# Patient Record
Sex: Female | Born: 2005 | Race: Black or African American | Hispanic: No | Marital: Single | Smoking: Never smoker
Health system: Southern US, Community
[De-identification: ages and names within clinical notes are randomized; demographics above are authoritative.]

## PROBLEM LIST (undated history)

## (undated) DIAGNOSIS — J45909 Unspecified asthma, uncomplicated: Secondary | ICD-10-CM

---
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
## 2023-08-19 ENCOUNTER — Encounter (HOSPITAL_COMMUNITY): Payer: Self-pay

## 2023-08-19 ENCOUNTER — Other Ambulatory Visit: Payer: Self-pay

## 2023-08-19 ENCOUNTER — Emergency Department (HOSPITAL_COMMUNITY)
Admission: EM | Admit: 2023-08-19 | Discharge: 2023-08-19 | Disposition: A | Discharge status: Home / Self Care | Payer: Medicaid Other | Attending: Emergency Medicine | Admitting: Emergency Medicine

## 2023-08-19 DIAGNOSIS — N72 Inflammatory disease of cervix uteri: Secondary | ICD-10-CM | POA: Insufficient documentation

## 2023-08-19 DIAGNOSIS — R102 Pelvic and perineal pain: Secondary | ICD-10-CM | POA: Diagnosis present

## 2023-08-19 LAB — WET PREP, GENITAL
Clue Cells Wet Prep HPF POC: NONE SEEN
Sperm: NONE SEEN
Trich, Wet Prep: NONE SEEN
WBC, Wet Prep HPF POC: >=10 — AB (ref <10)
Yeast Wet Prep HPF POC: NONE SEEN

## 2023-08-19 LAB — URINALYSIS, ROUTINE W REFLEX MICROSCOPIC
Bilirubin Urine: NEGATIVE
Glucose, UA: NEGATIVE mg/dL
Hgb urine dipstick: NEGATIVE
Ketones, ur: NEGATIVE mg/dL
Nitrite: NEGATIVE
Protein, ur: NEGATIVE mg/dL
Specific Gravity, Urine: 1.02 (ref 1.005–1.030)
WBC, UA: >50 WBC/hpf (ref 0–5)
pH: 6 (ref 5.0–8.0)

## 2023-08-19 LAB — HIV ANTIBODY (ROUTINE TESTING W REFLEX): HIV Screen 4th Generation wRfx: NONREACTIVE

## 2023-08-19 LAB — PREGNANCY, URINE: Preg Test, Ur: NEGATIVE

## 2023-08-19 MED ORDER — DOXYCYCLINE HYCLATE 100 MG PO CAPS: 100.0000 mg | ORAL_CAPSULE | Freq: Two times a day (BID) | ORAL | 0 refills | Status: DC

## 2023-08-19 MED ORDER — METRONIDAZOLE 500 MG PO TABS
2000.0000 mg | ORAL_TABLET | Freq: Once | ORAL | Status: AC
Start: 2023-08-19 — End: 2023-08-19
  Administered 2023-08-19: 2000 mg via ORAL
  Filled 2023-08-19: qty 4

## 2023-08-19 MED ORDER — LIDOCAINE HCL (PF) 1 % IJ SOLN
INTRAMUSCULAR | Status: AC
  Administered 2023-08-19: 1.5 mL
  Filled 2023-08-19: qty 5

## 2023-08-19 MED ORDER — AZITHROMYCIN 250 MG PO TABS
1000.0000 mg | ORAL_TABLET | Freq: Once | ORAL | Status: AC
  Administered 2023-08-19: 1000 mg via ORAL
  Filled 2023-08-19: qty 4

## 2023-08-19 MED ORDER — CEFTRIAXONE SODIUM 500 MG IJ SOLR
500.0000 mg | Freq: Once | INTRAMUSCULAR | Status: AC
  Administered 2023-08-19: 500 mg via INTRAMUSCULAR
  Filled 2023-08-19: qty 500

## 2023-08-19 NOTE — ED Provider Notes (Signed)
[place] EMERGENCY DEPARTMENT AT [facility] Provider Note   CSN: [id number] Arrival date & time: 08/19/23  1255     History  Chief Complaint  Patient presents with   Vaginal Pain    Angel Hamilton is a 17 y.o. female.  17 yo F with a chief complaints of vaginal pain.  This has been going on for a couple days.  She was recently seen and treated for bacterial vaginosis.  She said she was seen because she had a new odor.  She denies any pain in her discharge at that time.  Denies any new soaps detergents or lotions to the area.  Denies any new sexual lubricants.   Vaginal Pain       Home Medications Prior to Admission medications   Medication Sig Start Date End Date Taking? Authorizing Provider  albuterol (VENTOLIN HFA) 108 (90 Base) MCG/ACT inhaler Inhale 2 puffs into the lungs every 6 (six) hours as needed for wheezing or shortness of breath. 07/08/21  Yes Isla Pence, MD  doxycycline (VIBRAMYCIN) 100 MG capsule Take 1 capsule (100 mg total) by mouth 2 (two) times daily. One po bid x 7 days 08/19/23  Yes Melene Plan, DO      Allergies    Patient has no known allergies.    Review of Systems   Review of Systems  Genitourinary:  Positive for vaginal pain.    Physical Exam Updated Vital Signs BP 116/66   Pulse 91   Temp 98.5 F (36.9 C)   Resp 16   Ht 5\' 4"  (1.626 m)   Wt 49.9 kg   LMP 07/13/2023 (Approximate)   SpO2 100%   BMI 18.88 kg/m  Physical Exam Vitals and nursing note reviewed.  Constitutional:      General: She is not in acute distress.    Appearance: She is well-developed. She is not diaphoretic.  HENT:     Head: Normocephalic and atraumatic.  Eyes:     Pupils: Pupils are equal, round, and reactive to light.  Cardiovascular:     Rate and Rhythm: Normal rate and regular rhythm.     Heart sounds: No murmur heard.    No friction rub. No gallop.  Pulmonary:     Effort: Pulmonary effort is normal.     Breath sounds: No wheezing or  rales.  Abdominal:     General: There is no distension.     Palpations: Abdomen is soft.     Tenderness: There is no abdominal tenderness.  Genitourinary:    Comments: Purulent discharge at the cervix.  Cervical motion tenderness.  No obvious adnexal tenderness or masses. Musculoskeletal:        General: No tenderness.     Cervical back: Normal range of motion and neck supple.  Skin:    General: Skin is warm and dry.  Neurological:     Mental Status: She is alert and oriented to person, place, and time.  Psychiatric:        Behavior: Behavior normal.     ED Results / Procedures / Treatments   Labs (all labs ordered are listed, but only abnormal results are displayed) Labs Reviewed  WET PREP, GENITAL - Abnormal; Notable for the following components:      Result Value   WBC, Wet Prep HPF POC >=10 (*)    All other components within normal limits  URINALYSIS, ROUTINE W REFLEX MICROSCOPIC - Abnormal; Notable for the following components:   APPearance HAZY (*)  Leukocytes,Ua LARGE (*)    Bacteria, UA RARE (*)    All other components within normal limits  PREGNANCY, URINE  RPR  HIV ANTIBODY (ROUTINE TESTING W REFLEX)  GC/CHLAMYDIA PROBE AMP ([place]) NOT AT [facility]    EKG None  Radiology No results found.  Procedures Procedures    Medications Ordered in ED Medications  cefTRIAXone (ROCEPHIN) injection 500 mg (has no administration in time range)  azithromycin (ZITHROMAX) tablet 1,000 mg (has no administration in time range)  metroNIDAZOLE (FLAGYL) tablet 2,000 mg (has no administration in time range)    ED Course/ Medical Decision Making/ A&P                                 Medical Decision Making Amount and/or Complexity of Data Reviewed Labs: ordered.  Risk Prescription drug management.   17 yo F with a chief complaints of vaginal pain.  This has been going on for couple days.  Exam most consistent with PID.  Will treat with antibiotics.  PCP health  department OB/GYN follow-up.  6:18 PM:  I have discussed the diagnosis/risks/treatment options with the patient.  Evaluation and diagnostic testing in the emergency department does not suggest an emergent condition requiring admission or immediate intervention beyond what has been performed at this time.  They will follow up with PCP. We also discussed returning to the ED immediately if new or worsening sx occur. We discussed the sx which are most concerning (e.g., sudden worsening pain, fever, inability to tolerate by mouth) that necessitate immediate return. Medications administered to the patient during their visit and any new prescriptions provided to the patient are listed below.  Medications given during this visit Medications  cefTRIAXone (ROCEPHIN) injection 500 mg (has no administration in time range)  azithromycin (ZITHROMAX) tablet 1,000 mg (has no administration in time range)  metroNIDAZOLE (FLAGYL) tablet 2,000 mg (has no administration in time range)     The patient appears reasonably screen and/or stabilized for discharge and I doubt any other medical condition or other [facility] requiring further screening, evaluation, or treatment in the ED at this time prior to discharge.          Final Clinical Impression(s) / ED Diagnoses Final diagnoses:  Cervicitis    Rx / DC Orders ED Discharge Orders          Ordered    doxycycline (VIBRAMYCIN) 100 MG capsule  2 times daily        08/19/23 1818              Melene Plan, DO 08/19/23 1818

## 2023-08-19 NOTE — ED Provider Triage Note (Signed)
Emergency Medicine Provider Triage Evaluation Note  Angel Hamilton , a 17 y.o. female  was evaluated in triage.  Pt complains of dysuria, vaginal irritation. States same has been ongoing for 1 week.  When to the health department and had vaginal swabs collected as well as blood work done.  States she tested positive for BV and completed a course of antibiotics for this.  She is unsure what the rest of the results showed, however states she normally receives a call if anything is abnormal, and she did not receive any phone calls.  States she is having clear discharge that is unusual for her.  LMP was approximately 11/13.  States that she does have irregular cycles.  She is not on birth control and is sexually active.  Review of Systems  Positive:  Negative:   Physical Exam  BP 102/87 (BP Location: Right Arm)   Pulse 83   Temp 98.5 F (36.9 C) (Oral)   Resp 18   Ht 5\' 4"  (1.626 m)   Wt 49.9 kg   LMP 07/13/2023 (Approximate)   SpO2 100%   BMI 18.88 kg/m  Gen:   Awake, no distress   Resp:  Normal effort  MSK:   Moves extremities without difficulty  Other:    Medical Decision Making  Medically screening exam initiated at 1:47 PM.  Appropriate orders placed.  Veleda Hamilton was informed that the remainder of the evaluation will be completed by another provider, this initial triage assessment does not replace that evaluation, and the importance of remaining in the ED until their evaluation is complete.  Self swabs collected   Vear Clock 08/19/23 1348

## 2023-08-19 NOTE — ED Triage Notes (Signed)
Pt c/o vaginal pain x 1 week. Pt states she was diagnosed with BV last week and finished antibiotic a few days ago. Pt states she feels like labia minora are inflamed. Pt denies itching. Pain increased after sex. Pt has thin, pink discharge. Pt states she did have vaginal bleeding a few days ago.

## 2023-08-19 NOTE — Discharge Instructions (Addendum)
I am worried that you might have a sexually transmitted disease.  I have treated you for the common STDs.  You need to let your partners up to 90 days before today know to be tested and treated if your test does come back positive.  We do have a protocol or someone should call you to let you know but that system is not perfect and so if you have MyChart you can check it on your own.  The health department could possibly see you for problems like this in the future.  Please establish care with a primary care provider and an OB/GYN.

## 2023-08-20 LAB — RPR: RPR Ser Ql: NONREACTIVE

## 2023-08-23 LAB — GC/CHLAMYDIA PROBE AMP (~~LOC~~) NOT AT ARMC
Chlamydia: NEGATIVE
Comment: NEGATIVE
Comment: NORMAL
Neisseria Gonorrhea: NEGATIVE

## 2023-11-09 ENCOUNTER — Encounter (HOSPITAL_BASED_OUTPATIENT_CLINIC_OR_DEPARTMENT_OTHER): Payer: Self-pay

## 2023-11-09 ENCOUNTER — Emergency Department (HOSPITAL_BASED_OUTPATIENT_CLINIC_OR_DEPARTMENT_OTHER)
Admission: EM | Admit: 2023-11-09 | Discharge: 2023-11-09 | Disposition: A | Discharge status: Home / Self Care | Attending: Emergency Medicine | Admitting: Emergency Medicine

## 2023-11-09 ENCOUNTER — Other Ambulatory Visit: Payer: Self-pay

## 2023-11-09 DIAGNOSIS — K625 Hemorrhage of anus and rectum: Secondary | ICD-10-CM | POA: Diagnosis present

## 2023-11-09 DIAGNOSIS — R1030 Lower abdominal pain, unspecified: Secondary | ICD-10-CM | POA: Insufficient documentation

## 2023-11-09 DIAGNOSIS — R519 Headache, unspecified: Secondary | ICD-10-CM | POA: Insufficient documentation

## 2023-11-09 DIAGNOSIS — R6883 Chills (without fever): Secondary | ICD-10-CM | POA: Diagnosis not present

## 2023-11-09 LAB — CBC
HCT: 33.2 % — ABNORMAL LOW (ref 36.0–46.0)
Hemoglobin: 10.8 g/dL — ABNORMAL LOW (ref 12.0–15.0)
MCH: 28 pg (ref 26.0–34.0)
MCHC: 32.5 g/dL (ref 30.0–36.0)
MCV: 86 fL (ref 80.0–100.0)
Platelets: 266 10*3/uL (ref 150–400)
RBC: 3.86 MIL/uL — ABNORMAL LOW (ref 3.87–5.11)
RDW: 13.5 % (ref 11.5–15.5)
WBC: 6.9 10*3/uL (ref 4.0–10.5)
nRBC: 0 % (ref 0.0–0.2)

## 2023-11-09 LAB — COMPREHENSIVE METABOLIC PANEL
ALT: 13 U/L (ref 0–44)
AST: 19 U/L (ref 15–41)
Albumin: 4.3 g/dL (ref 3.5–5.0)
Alkaline Phosphatase: 60 U/L (ref 38–126)
Anion gap: 7 (ref 5–15)
BUN: 14 mg/dL (ref 6–20)
CO2: 27 mmol/L (ref 22–32)
Calcium: 8.9 mg/dL (ref 8.9–10.3)
Chloride: 104 mmol/L (ref 98–111)
Creatinine, Ser: 0.63 mg/dL (ref 0.44–1.00)
GFR, Estimated: >60 mL/min (ref >60)
Glucose, Bld: 85 mg/dL (ref 70–99)
Potassium: 3.9 mmol/L (ref 3.5–5.1)
Sodium: 138 mmol/L (ref 135–145)
Total Bilirubin: 0.6 mg/dL (ref 0.0–1.2)
Total Protein: 6.7 g/dL (ref 6.5–8.1)

## 2023-11-09 LAB — OCCULT BLOOD X 1 CARD TO LAB, STOOL: Fecal Occult Bld: NEGATIVE

## 2023-11-09 LAB — PREGNANCY, URINE: Preg Test, Ur: NEGATIVE

## 2023-11-09 NOTE — Discharge Instructions (Addendum)
 Please follow-up outpatient with your PCP.  If you have persistent symptoms, a referral has been placed for outpatient follow-up with gastroenterology.  If you develop any vaginal or rectal lesions this would be concerning for potential STI, if you develop significant worsening pain with purulent discharge this would also be concerning for an STI.

## 2023-11-09 NOTE — ED Triage Notes (Signed)
 Pt presents via POV c/o rectal bleeding x3 days. Reports dark red blood per rectum. Pt also endorses a headache. Reports abd cramping.   Pt A&O x4.

## 2023-11-09 NOTE — ED Notes (Signed)
 Mother at bedside.

## 2023-11-09 NOTE — ED Notes (Signed)
 Warm blanket given
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
## 2023-11-22 ENCOUNTER — Other Ambulatory Visit: Payer: Self-pay

## 2023-11-22 DIAGNOSIS — N6310 Unspecified lump in the right breast, unspecified quadrant: Secondary | ICD-10-CM

## 2023-11-22 DIAGNOSIS — N6320 Unspecified lump in the left breast, unspecified quadrant: Secondary | ICD-10-CM

## 2024-01-12 ENCOUNTER — Other Ambulatory Visit

## 2024-02-10 ENCOUNTER — Ambulatory Visit (INDEPENDENT_AMBULATORY_CARE_PROVIDER_SITE_OTHER)

## 2024-02-10 ENCOUNTER — Ambulatory Visit
Admission: EM | Admit: 2024-02-10 | Discharge: 2024-02-10 | Disposition: A | Discharge status: Home / Self Care | Attending: Physician Assistant | Admitting: Physician Assistant

## 2024-02-10 ENCOUNTER — Encounter: Payer: Self-pay | Admitting: Emergency Medicine

## 2024-02-10 ENCOUNTER — Other Ambulatory Visit

## 2024-02-10 ENCOUNTER — Ambulatory Visit: Payer: Self-pay | Admitting: Physician Assistant

## 2024-02-10 DIAGNOSIS — S62635A Displaced fracture of distal phalanx of left ring finger, initial encounter for closed fracture: Secondary | ICD-10-CM | POA: Diagnosis not present

## 2024-02-10 LAB — POCT URINE PREGNANCY: Preg Test, Ur: NEGATIVE

## 2024-02-10 MED ORDER — IBUPROFEN 600 MG PO TABS: 600.0000 mg | ORAL_TABLET | Freq: Three times a day (TID) | ORAL | 0 refills | Status: AC | PRN
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
## 2024-02-10 NOTE — ED Provider Notes (Signed)
 [facility]    CSN: [id number] Arrival date & time: 02/10/24  0859      History   Chief Complaint Chief Complaint  Patient presents with   Finger Injury    HPI Angel Hamilton is a 18 y.o. female.   Patient presents today with a 12-hour history of left ring finger pain following injury.  Reports that she was play fighting with her friend when her finger got crushed.  She has had ongoing pain with associated bruising over the finger pad since that time.  Pain is rated 7/8 on a 0-10 pain scale, described as throbbing, no aggravating alleviating factors identified.  She does report some tingling sensation but denies any numbness.  She is right-handed.  She has not been take any over-the-counter medication for symptom management.  She has never injured her hand before or had any kind of surgery.  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    Past Medical History:  Diagnosis Date   Asthma     Patient Active Problem List   Diagnosis Date Noted   Nexplanon in place 01/02/2020    History reviewed. No pertinent surgical history.  OB History   No obstetric history on file.      Home Medications    Prior to Admission medications   Medication Sig Start Date End Date Taking? Authorizing Provider  ibuprofen  (ADVIL ) 600 MG tablet Take 1 tablet (600 mg total) by mouth every 8 (eight) hours as needed. 02/10/24  Yes Emiline Mancebo, Betsey Brow, PA-C  albuterol  (VENTOLIN  HFA) 108 (90 Base) MCG/ACT inhaler Inhale 2 puffs into the lungs every 6 (six) hours as needed for wheezing or shortness of breath. 07/08/21   Gordon Latus, MD    Family History History reviewed. No pertinent family history.  Social History Social History   Tobacco Use   Smoking status: Never    Passive exposure: Never   Smokeless tobacco: Never  Vaping Use   Vaping status: Never Used  Substance Use Topics   Alcohol use: No   Drug use: No      Allergies   Patient has no known allergies.   Review of Systems Review of Systems  Constitutional:  Positive for activity change. Negative for appetite change, fatigue and fever.  Musculoskeletal:  Positive for arthralgias. Negative for myalgias.  Skin:  Positive for color change. Negative for wound.  Neurological:  Negative for weakness and numbness.     Physical Exam Triage Vital Signs ED Triage Vitals [02/10/24 0930]  Encounter Vitals Group     BP 103/68     Girls Systolic BP Percentile      Girls Diastolic BP Percentile      Boys Systolic BP Percentile      Boys Diastolic BP Percentile      Pulse Rate 81     Resp 14     Temp 98.7 F (37.1 C)     Temp Source Oral     SpO2 97 %     Weight      Height      Head Circumference      Peak Flow      Pain Score 8     Pain Loc      Pain Education      Exclude from Growth Chart    No data found.  Updated Vital Signs BP 103/68 (BP Location: Left Arm)   Pulse 81  Temp 98.7 F (37.1 C) (Oral)   Resp 14   LMP 12/22/2023 (Approximate)   SpO2 97%   Visual Acuity Right Eye Distance:   Left Eye Distance:   Bilateral Distance:    Right Eye Near:   Left Eye Near:    Bilateral Near:     Physical Exam Vitals reviewed.  Constitutional:      General: She is awake. She is not in acute distress.    Appearance: Normal appearance. She is well-developed. She is not ill-appearing.     Comments: Very pleasant female presented age in no acute distress sitting comfortably in exam room  HENT:     Head: Normocephalic and atraumatic.   Cardiovascular:     Rate and Rhythm: Normal rate and regular rhythm.     Heart sounds: Normal heart sounds, S1 normal and S2 normal. No murmur heard.    Comments: Capillary refill within 2 seconds left fingers Pulmonary:     Effort: Pulmonary effort is normal.     Breath sounds: Normal breath sounds. No wheezing, rhonchi or rales.     Comments: Clear to auscultation  bilaterally  Musculoskeletal:     Left hand: Tenderness and bony tenderness present. No swelling. Decreased range of motion. There is no disruption of two-point discrimination. Normal capillary refill.     Comments: Left ring finger: Decreased range of motion with flexion of distal finger secondary to pain and swelling.  Bruising noted over left ring finger finger pad.  Capillary refill within 2 seconds.  Normal sensation.  No deformity noted.   Psychiatric:        Behavior: Behavior is cooperative.      UC Treatments / Results  Labs (all labs ordered are listed, but only abnormal results are displayed) Labs Reviewed  POCT URINE PREGNANCY - Normal    EKG   Radiology DG Finger Ring Left Result Date: 02/10/2024 CLINICAL DATA:  Injury with severe pain, swelling, bruising, and reduced ROM EXAM: LEFT RING FINGER 2+V COMPARISON:  None Available. FINDINGS: Acute fracture of the fourth distal phalanx with fracture margins extending from the dorsal proximal diaphysis through the distal palmar diaphysis. There is mild dorsal angulation. No evidence of intra-articular extension. Joint spaces are maintained. Soft tissue swelling of the fourth digit. No radiopaque foreign body. IMPRESSION: Acute mildly angulated fracture of the fourth distal phalanx. No evidence of intra-articular extension. Electronically Signed   By: Mannie Seek M.D.   On: 02/10/2024 10:26    Procedures Procedures (including critical care time)  Medications Ordered in UC Medications - No data to display  Initial Impression / Assessment and Plan / UC Course  I have reviewed the triage vital signs and the nursing notes.  Pertinent labs & imaging results that were available during my care of the patient were reviewed by me and considered in my medical decision making (see chart for details).     Patient is well-appearing, afebrile, nontoxic, nontachycardic.  Finger is neurovascularly intact with no signs of compartment  syndrome on exam.  X-ray was obtained given mechanism of injury that showed closed angulated fracture of distal fourth phalanx on the left hand.  Contacted Steffanie Edouard, [place] with orthopedics to discuss case who recommended buddy tape and static splint with follow-up with hand next week.  Patient was placed in buddy tape and splint and encouraged to keep this elevated.  She was given the contact information for hand specialist on-call with instruction to call to schedule appointment as it is possible.  She was given ibuprofen  for pain relief and we discussed that she is not to take additional NSAIDs.  Can use Tylenol for breakthrough pain.  We discussed that if she has any worsening or changing symptoms she needs to be seen immediately.  Strict return precautions given.  Excuse note provided.  Final Clinical Impressions(s) / UC Diagnoses   Final diagnoses:  Closed displaced fracture of distal phalanx of left ring finger, initial encounter     Discharge Instructions      You have a fracture part of your finger.  Please stay in the splint and buddy taping until you see the orthopedist.  Call them to schedule an appointment as soon as possible.  Take ibuprofen  for pain.  Do not take additional NSAIDs with this medication including aspirin, ibuprofen /Advil , naproxen/Aleve.  You can use Tylenol for breakthrough pain.  If you have any increased swelling, pain, numbness or tingling, discoloration of the finger, cold sensation in the finger, decreased capillary refill as we showed you you need to go to the ER.    ED Prescriptions     Medication Sig Dispense Auth. Provider   ibuprofen  (ADVIL ) 600 MG tablet Take 1 tablet (600 mg total) by mouth every 8 (eight) hours as needed. 30 tablet Mayara Paulson K, PA-C      PDMP not reviewed this encounter.   Budd Cargo, PA-C 02/10/24 1127

## 2024-02-10 NOTE — Discharge Instructions (Signed)
 You have a fracture part of your finger.  Please stay in the splint and buddy taping until you see the orthopedist.  Call them to schedule an appointment as soon as possible.  Take ibuprofen  for pain.  Do not take additional NSAIDs with this medication including aspirin, ibuprofen /Advil , naproxen/Aleve.  You can use Tylenol for breakthrough pain.  If you have any increased swelling, pain, numbness or tingling, discoloration of the finger, cold sensation in the finger, decreased capillary refill as we showed you you need to go to the ER.

## 2024-02-13 ENCOUNTER — Ambulatory Visit
Admission: RE | Admit: 2024-02-13 | Discharge: 2024-02-13 | Disposition: A | Discharge status: Home / Self Care | Source: Ambulatory Visit

## 2024-02-13 DIAGNOSIS — N6310 Unspecified lump in the right breast, unspecified quadrant: Secondary | ICD-10-CM

## 2024-02-13 DIAGNOSIS — N6320 Unspecified lump in the left breast, unspecified quadrant: Secondary | ICD-10-CM

## 2024-02-23 ENCOUNTER — Encounter: Admitting: Obstetrics and Gynecology

## 2024-02-24 ENCOUNTER — Other Ambulatory Visit

## 2024-03-07 NOTE — Progress Notes (Deleted)
 GYNECOLOGY  VISIT   HPI: Angel Hamilton is a 18 y.o.   Single  {Race/ethnicity:17218}  female   No obstetric history on file. here for ***    GYNECOLOGIC HISTORY: Patient's last menstrual period was 12/22/2023 (approximate). Contraception: {contraception:315051}.   Menopausal hormone therapy:  *** Last mammogram:  *** Last pap smear: No results found for: DIAGPAP         OB History   No obstetric history on file.        Patient Active Problem List   Diagnosis Date Noted   Nexplanon in place 01/02/2020    Past Medical History:  Diagnosis Date   Asthma     No past surgical history on file.  Current Outpatient Medications  Medication Sig Dispense Refill   albuterol  (VENTOLIN  HFA) 108 (90 Base) MCG/ACT inhaler Inhale 2 puffs into the lungs every 6 (six) hours as needed for wheezing or shortness of breath. 8 g 2   ibuprofen  (ADVIL ) 600 MG tablet Take 1 tablet (600 mg total) by mouth every 8 (eight) hours as needed. 30 tablet 0   No current facility-administered medications for this visit.     ALLERGIES: Patient has no known allergies.  No family history on file.  Social History   Socioeconomic History   Marital status: Single    Spouse name: Not on file   Number of children: Not on file   Years of education: Not on file   Highest education level: Not on file  Occupational History   Not on file  Tobacco Use   Smoking status: Never    Passive exposure: Never   Smokeless tobacco: Never  Vaping Use   Vaping status: Never Used  Substance and Sexual Activity   Alcohol use: No   Drug use: No   Sexual activity: Never  Other Topics Concern   Not on file  Social History Narrative   Not on file   Social Drivers of Health   Financial Resource Strain: Not on file  Food Insecurity: No Food Insecurity (01/02/2020)   Hunger Vital Sign    Worried About Running Out of Food in the Last Year: Never true    Ran Out of Food in the Last Year: Never true  Transportation  Needs: No Transportation Needs (01/02/2020)   PRAPARE - Administrator, Civil Service (Medical): No    Lack of Transportation (Non-Medical): No  Physical Activity: Not on file  Stress: Not on file  Social Connections: Not on file  Intimate Partner Violence: Not on file    Review of Systems  PHYSICAL EXAMINATION:    LMP 12/22/2023 (Approximate)     General appearance: alert, cooperative and appears stated age Head: Normocephalic, without obvious abnormality, atraumatic Neck: no adenopathy, supple, symmetrical, trachea midline and thyroid  normal to inspection and palpation Lungs: clear to auscultation bilaterally Breasts: normal appearance, no masses or tenderness, No nipple retraction or dimpling, No nipple discharge or bleeding, No axillary or supraclavicular adenopathy Heart: regular rate and rhythm Abdomen: soft, non-tender, no masses,  no organomegaly Extremities: extremities normal, atraumatic, no cyanosis or edema Skin: Skin color, texture, turgor normal. No rashes or lesions Lymph nodes: Cervical, supraclavicular, and axillary nodes normal. No abnormal inguinal nodes palpated Neurologic: Grossly normal  Pelvic: External genitalia:  no lesions              Urethra:  normal appearing urethra with no masses, tenderness or lesions  Bartholins and Skenes: normal                 Vagina: normal appearing vagina with normal color and discharge, no lesions              Cervix: no lesions                Bimanual Exam:  Uterus:  normal size, contour, position, consistency, mobility, non-tender              Adnexa: no mass, fullness, tenderness              Rectal exam: {yes no:314532}.  Confirms.              Anus:  normal sphincter tone, no lesions  Chaperone was present for exam  ASSESSMENT & PLAN  There are no diagnoses linked to this encounter.      An After Visit Summary was printed and given to the patient.   Taos Tapp E Vern Prestia, [place] 7/9/20258:08  AM

## 2024-03-08 ENCOUNTER — Encounter: Admitting: Physician Assistant
